# Patient Record
Sex: Female | Born: 1945 | Race: White | Hispanic: No | Marital: Married | Smoking: Never smoker
Health system: Southern US, Community
[De-identification: ages and names within clinical notes are randomized; demographics above are authoritative.]

## PROBLEM LIST (undated history)

## (undated) DIAGNOSIS — F32A Depression, unspecified: Secondary | ICD-10-CM

## (undated) DIAGNOSIS — F329 Major depressive disorder, single episode, unspecified: Secondary | ICD-10-CM

## (undated) HISTORY — PX: BREAST REDUCTION SURGERY: SHX8

---
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
## 2004-06-05 ENCOUNTER — Inpatient Hospital Stay: Payer: Self-pay | Admitting: Psychiatry

## 2006-08-02 ENCOUNTER — Emergency Department: Payer: Self-pay | Admitting: Emergency Medicine

## 2009-02-11 ENCOUNTER — Ambulatory Visit: Payer: Self-pay | Admitting: Unknown Physician Specialty

## 2009-02-21 ENCOUNTER — Ambulatory Visit: Payer: Self-pay | Admitting: Unknown Physician Specialty

## 2009-10-08 ENCOUNTER — Ambulatory Visit: Payer: Self-pay | Admitting: Unknown Physician Specialty

## 2009-10-09 ENCOUNTER — Ambulatory Visit: Payer: Self-pay | Admitting: Unknown Physician Specialty

## 2010-05-11 ENCOUNTER — Ambulatory Visit: Payer: Self-pay | Admitting: Internal Medicine

## 2011-04-17 ENCOUNTER — Emergency Department: Payer: Self-pay | Admitting: Emergency Medicine

## 2011-05-27 ENCOUNTER — Ambulatory Visit: Payer: Self-pay | Admitting: Internal Medicine

## 2012-05-31 ENCOUNTER — Ambulatory Visit: Payer: Self-pay | Admitting: Internal Medicine

## 2019-05-01 ENCOUNTER — Other Ambulatory Visit: Payer: Self-pay

## 2019-05-01 ENCOUNTER — Encounter: Payer: Self-pay | Admitting: Emergency Medicine

## 2019-05-01 ENCOUNTER — Emergency Department: Payer: Medicare Other

## 2019-05-01 ENCOUNTER — Emergency Department
Admission: EM | Admit: 2019-05-01 | Discharge: 2019-05-03 | Disposition: A | Discharge status: Nursing Home (Includes ICF) | Payer: Medicare Other | Attending: Emergency Medicine | Admitting: Emergency Medicine

## 2019-05-01 DIAGNOSIS — Z79899 Other long term (current) drug therapy: Secondary | ICD-10-CM | POA: Insufficient documentation

## 2019-05-01 DIAGNOSIS — R2681 Unsteadiness on feet: Secondary | ICD-10-CM | POA: Diagnosis not present

## 2019-05-01 DIAGNOSIS — R531 Weakness: Secondary | ICD-10-CM | POA: Insufficient documentation

## 2019-05-01 DIAGNOSIS — T426X2A Poisoning by other antiepileptic and sedative-hypnotic drugs, intentional self-harm, initial encounter: Secondary | ICD-10-CM | POA: Diagnosis not present

## 2019-05-01 DIAGNOSIS — Y939 Activity, unspecified: Secondary | ICD-10-CM | POA: Insufficient documentation

## 2019-05-01 DIAGNOSIS — S22080A Wedge compression fracture of T11-T12 vertebra, initial encounter for closed fracture: Secondary | ICD-10-CM | POA: Diagnosis not present

## 2019-05-01 DIAGNOSIS — F319 Bipolar disorder, unspecified: Secondary | ICD-10-CM | POA: Insufficient documentation

## 2019-05-01 DIAGNOSIS — Y999 Unspecified external cause status: Secondary | ICD-10-CM | POA: Diagnosis not present

## 2019-05-01 DIAGNOSIS — R5381 Other malaise: Secondary | ICD-10-CM

## 2019-05-01 DIAGNOSIS — W19XXXA Unspecified fall, initial encounter: Secondary | ICD-10-CM | POA: Diagnosis not present

## 2019-05-01 DIAGNOSIS — Y929 Unspecified place or not applicable: Secondary | ICD-10-CM | POA: Insufficient documentation

## 2019-05-01 DIAGNOSIS — F329 Major depressive disorder, single episode, unspecified: Secondary | ICD-10-CM | POA: Diagnosis present

## 2019-05-01 DIAGNOSIS — Z20822 Contact with and (suspected) exposure to covid-19: Secondary | ICD-10-CM | POA: Insufficient documentation

## 2019-05-01 DIAGNOSIS — T50902A Poisoning by unspecified drugs, medicaments and biological substances, intentional self-harm, initial encounter: Secondary | ICD-10-CM

## 2019-05-01 DIAGNOSIS — Z723 Lack of physical exercise: Secondary | ICD-10-CM | POA: Diagnosis not present

## 2019-05-01 HISTORY — DX: Depression, unspecified: F32.A

## 2019-05-01 LAB — CBC WITH DIFFERENTIAL/PLATELET
Abs Immature Granulocytes: 0.04 10*3/uL (ref 0.00–0.07)
Basophils Absolute: 0 10*3/uL (ref 0.0–0.1)
Basophils Relative: 0 %
Eosinophils Absolute: 0 10*3/uL (ref 0.0–0.5)
Eosinophils Relative: 0 %
HCT: 41.5 % (ref 36.0–46.0)
Hemoglobin: 13.1 g/dL (ref 12.0–15.0)
Immature Granulocytes: 1 %
Lymphocytes Relative: 4 %
Lymphs Abs: 0.3 10*3/uL — ABNORMAL LOW (ref 0.7–4.0)
MCH: 29.2 pg (ref 26.0–34.0)
MCHC: 31.6 g/dL (ref 30.0–36.0)
MCV: 92.4 fL (ref 80.0–100.0)
Monocytes Absolute: 0.1 10*3/uL (ref 0.1–1.0)
Monocytes Relative: 1 %
Neutro Abs: 8.1 10*3/uL — ABNORMAL HIGH (ref 1.7–7.7)
Neutrophils Relative %: 94 %
Platelets: 279 10*3/uL (ref 150–400)
RBC: 4.49 MIL/uL (ref 3.87–5.11)
RDW: 13.8 % (ref 11.5–15.5)
WBC: 8.6 10*3/uL (ref 4.0–10.5)
nRBC: 0 % (ref 0.0–0.2)

## 2019-05-01 LAB — ACETAMINOPHEN LEVEL: Acetaminophen (Tylenol), Serum: <10 ug/mL — ABNORMAL LOW (ref 10–30)

## 2019-05-01 LAB — COMPREHENSIVE METABOLIC PANEL
ALT: 15 U/L (ref 0–44)
AST: 28 U/L (ref 15–41)
Albumin: 4 g/dL (ref 3.5–5.0)
Alkaline Phosphatase: 102 U/L (ref 38–126)
Anion gap: 17 — ABNORMAL HIGH (ref 5–15)
BUN: 16 mg/dL (ref 8–23)
CO2: 20 mmol/L — ABNORMAL LOW (ref 22–32)
Calcium: 8.8 mg/dL — ABNORMAL LOW (ref 8.9–10.3)
Chloride: 102 mmol/L (ref 98–111)
Creatinine, Ser: 1 mg/dL (ref 0.44–1.00)
GFR calc Af Amer: >60 mL/min (ref >60)
GFR calc non Af Amer: 56 mL/min — ABNORMAL LOW (ref >60)
Glucose, Bld: 199 mg/dL — ABNORMAL HIGH (ref 70–99)
Potassium: 4.2 mmol/L (ref 3.5–5.1)
Sodium: 139 mmol/L (ref 135–145)
Total Bilirubin: 0.5 mg/dL (ref 0.3–1.2)
Total Protein: 7.8 g/dL (ref 6.5–8.1)

## 2019-05-01 LAB — URINE DRUG SCREEN, QUALITATIVE (ARMC ONLY)
Amphetamines, Ur Screen: NOT DETECTED
Barbiturates, Ur Screen: NOT DETECTED
Benzodiazepine, Ur Scrn: NOT DETECTED
Cannabinoid 50 Ng, Ur ~~LOC~~: NOT DETECTED
Cocaine Metabolite,Ur ~~LOC~~: NOT DETECTED
MDMA (Ecstasy)Ur Screen: NOT DETECTED
Methadone Scn, Ur: NOT DETECTED
Opiate, Ur Screen: NOT DETECTED
Phencyclidine (PCP) Ur S: NOT DETECTED
Tricyclic, Ur Screen: NOT DETECTED

## 2019-05-01 LAB — TROPONIN I (HIGH SENSITIVITY)
Troponin I (High Sensitivity): 7 ng/L (ref <18)
Troponin I (High Sensitivity): 6 ng/L (ref <18)

## 2019-05-01 LAB — URINALYSIS, COMPLETE (UACMP) WITH MICROSCOPIC
Bacteria, UA: NONE SEEN
Bilirubin Urine: NEGATIVE
Glucose, UA: NEGATIVE mg/dL
Hgb urine dipstick: NEGATIVE
Ketones, ur: 20 mg/dL — AB
Leukocytes,Ua: NEGATIVE
Nitrite: NEGATIVE
Protein, ur: 30 mg/dL — AB
Specific Gravity, Urine: 1.021 (ref 1.005–1.030)
Squamous Epithelial / HPF: NONE SEEN (ref 0–5)
pH: 5 (ref 5.0–8.0)

## 2019-05-01 LAB — TSH: TSH: 1.362 u[IU]/mL (ref 0.350–4.500)

## 2019-05-01 LAB — SALICYLATE LEVEL: Salicylate Lvl: <7 mg/dL — ABNORMAL LOW (ref 7.0–30.0)

## 2019-05-01 LAB — ETHANOL: Alcohol, Ethyl (B): <10 mg/dL (ref <10)

## 2019-05-01 LAB — T4, FREE: Free T4: 1.07 ng/dL (ref 0.61–1.12)

## 2019-05-01 MED ORDER — SODIUM CHLORIDE 0.9 % IV BOLUS
500.0000 mL | Freq: Once | INTRAVENOUS | Status: AC
  Administered 2019-05-01: 500 mL via INTRAVENOUS

## 2019-05-01 MED ORDER — LORAZEPAM 2 MG/ML IJ SOLN
1.0000 mg | Freq: Once | INTRAMUSCULAR | Status: AC
  Administered 2019-05-01: 1 mg via INTRAVENOUS
  Filled 2019-05-01: qty 1

## 2019-05-01 NOTE — ED Notes (Signed)
Spoken with Verlon Au from poison control, updated on pt status

## 2019-05-01 NOTE — ED Notes (Signed)
Pt anxious, given ativan 1mg  per md. Pt to xray at this time

## 2019-05-01 NOTE — ED Notes (Signed)
Attempted to cal family

## 2019-05-01 NOTE — ED Notes (Signed)
Hourly rounding reveals patient in room. No complaints, stable, in no acute distress.   

## 2019-05-01 NOTE — ED Notes (Signed)
Pt returned from xray. Hourly rounding reveals patient in room. No complaints, stable, in no acute distress.

## 2019-05-01 NOTE — ED Notes (Signed)
Pt's medications brought in by EMS counted by this RN, given to pharm tech Connellae to be delivered to pharmacy to store while patient in hospital. Form placed with patient's chart.

## 2019-05-01 NOTE — ED Notes (Signed)
Hourly rounding reveals patient in room. No complaints, stable, in no acute distress.

## 2019-05-01 NOTE — BH Assessment (Addendum)
Referral information for Psychiatric Hospitalization faxed to;   [facility] [phone]), Denied  . [facility] [phone] -or- [phone]) [phone] No beds available, currently on waitlist  . [facility] ([phone]---[phone]), No current beds available   . [facility] [phone]), Re-faxed manually  . [facility] [phone] or [phone]) Re-faxed manually   . [facility] [phone] or [phone]), Currently under review by Amy

## 2019-05-01 NOTE — Consult Note (Signed)
[facility] Face-to-Face Psychiatry Consult   Reason for Consult:  Suicide attempt  Referring Physician: Dr. Mayford Knife Patient Identification: Sharon Hogan MRN:  [id number] Principal Diagnosis: <principal problem not specified> Diagnosis:  Active Problems:   * No active hospital problems. *   Total Time spent with patient: 45 minutes  Subjective:   Sharon Hogan is a 74 y.o. female patient admitted with history of depression and anxiety worsening over the course of a few weeks culminating in a suicide attempt last night.    HPI:  Pt is a 74 y.o. female patient admitted with history of depression worsening over the course of a few weeks culminating in a suicide attempt last night.  Patient reportedly took approximately 12 pills each of Klonopin and Lamictal in an attempt to commit suicide.  Patient reports that she has never tried to commit suicide previously and that she had been thinking about taking her pills as a form of suicide for the past couple of weeks.  Patient denies being acutely suicidal at this moment.  When asked about the reason for her suicide attempt patient reported severe anxiety around showering and that she has not showered since last July due to severe depression, agoraphobia and claustrophobia patient also reported that she is unable to get into the tub due to weak legs and pain in her back.  Patient has had multiple inpatient psychiatric stays in the past and she has been treated as an outpatient by Dr. Omelia Blackwater for the last couple of years however her last outpatient visit was around last December.  Patient reported having 1 biological child and 2 stepchildren who live in another part of the state.  She reports having a good relationship with them by phone but that she does not want them to see her in person because she is embarrassed about her state of self-care at the moment.  She reported that her husband use to help her with her hygiene however his back has been bad for a little  while and he has been unable.  She reports a recent history of panic attacks that began around last December and have contributed to her overall condition at this time.  Patient was visibly anxious and tearful throughout the interview seem to be in distress and was given Ativan to help calm down.   Past Psychiatric History: Depression treated with Lamictal and Klonopin.  2 prior psychiatric admissions for similar circumstances last one was approximately "a couple years ago".  Patient denies prior suicide attempts but does endorse prior suicidal ideation.  Currently receiving treatment from outpatient Dr. Mitzi Hansen.  Risk to Self: Suicidal Ideation: Yes-Currently Present Suicidal Intent: Yes-Currently Present Is patient at risk for suicide?: Yes Suicidal Plan?: Yes-Currently Present Specify Current Suicidal Plan: (overdose on Lamictal and Trazodone) Access to Means: Yes Specify Access to Suicidal Means: took Rx pills What has been your use of drugs/alcohol within the last 12 months?: none How many times?: 0 Other Self Harm Risks: conflict with husband Triggers for Past Attempts: None known Intentional Self Injurious Behavior: None Risk to Others: Homicidal Ideation: No Thoughts of Harm to Others: No Current Homicidal Intent: No Current Homicidal Plan: No Access to Homicidal Means: No Identified Victim: none History of harm to others?: No Assessment of Violence: None Noted Violent Behavior Description: none Does patient have access to weapons?: No Criminal Charges Pending?: No Does patient have a court date: No Prior Inpatient Therapy: Prior Inpatient Therapy: Yes Prior Therapy Dates: "a few year back" Prior  Therapy Facilty/Provider(s): [facility] Reason for Treatment: depression Prior Outpatient Therapy: Prior Outpatient Therapy: Yes Prior Therapy Dates: active Prior Therapy Facilty/Provider(s): Dr. Omelia Blackwater Reason for Treatment: depression Does patient have an ACCT team?:  No Does patient have Intensive In-House Services?  : No Does patient have [facility] services? : No Does patient have P4CC services?: No  Past Medical History:  Past Medical History:  Diagnosis Date  . Depression     Past Surgical History:  Procedure Laterality Date  . BREAST REDUCTION SURGERY     Family History: No family history on file. Family Psychiatric  History: Unknown Social History:  Social History   Substance and Sexual Activity  Alcohol Use Not Currently     Social History   Substance and Sexual Activity  Drug Use Not Currently    Social History   Socioeconomic History  . Marital status: Married    Spouse name: Not on file  . Number of children: Not on file  . Years of education: Not on file  . Highest education level: Not on file  Occupational History  . Not on file  Tobacco Use  . Smoking status: Never Smoker  . Smokeless tobacco: Never Used  Substance and Sexual Activity  . Alcohol use: Not Currently  . Drug use: Not Currently  . Sexual activity: Not on file  Other Topics Concern  . Not on file  Social History Narrative  . Not on file   Social Determinants of Health   Financial Resource Strain:   . Difficulty of Paying Living Expenses: Not on file  Food Insecurity:   . Worried About Programme researcher, broadcasting/film/video in the Last Year: Not on file  . Ran Out of Food in the Last Year: Not on file  Transportation Needs:   . Lack of Transportation (Medical): Not on file  . Lack of Transportation (Non-Medical): Not on file  Physical Activity:   . Days of Exercise per Week: Not on file  . Minutes of Exercise per Session: Not on file  Stress:   . Feeling of Stress : Not on file  Social Connections:   . Frequency of Communication with Friends and Family: Not on file  . Frequency of Social Gatherings with Friends and Family: Not on file  . Attends Religious Services: Not on file  . Active Member of Clubs or Organizations: Not on file  . Attends Tax inspector Meetings: Not on file  . Marital Status: Not on file   Additional Social History: Patient has 1 biological daughter and 2 stepchildren who live in another part of the state that she has a good relationship with by phone.  Patient's husband is reported to be very supportive.    Allergies:   Allergies  Allergen Reactions  . Penicillin G Rash    Only in the injection form    Labs:  Results for orders placed or performed during the hospital encounter of 05/01/19 (from the past 48 hour(s))  CBC with Differential     Status: Abnormal   Collection Time: 05/01/19  8:45 AM  Result Value Ref Range   WBC 8.6 4.0 - 10.5 K/uL   RBC 4.49 3.87 - 5.11 MIL/uL   Hemoglobin 13.1 12.0 - 15.0 g/dL   HCT 88.5 02.7 - 74.1 %   MCV 92.4 80.0 - 100.0 fL   MCH 29.2 26.0 - 34.0 pg   MCHC 31.6 30.0 - 36.0 g/dL   RDW 28.7 86.7 - 67.2 %  Platelets 279 150 - 400 K/uL   nRBC 0.0 0.0 - 0.2 %   Neutrophils Relative % 94 %   Neutro Abs 8.1 (H) 1.7 - 7.7 K/uL   Lymphocytes Relative 4 %   Lymphs Abs 0.3 (L) 0.7 - 4.0 K/uL   Monocytes Relative 1 %   Monocytes Absolute 0.1 0.1 - 1.0 K/uL   Eosinophils Relative 0 %   Eosinophils Absolute 0.0 0.0 - 0.5 K/uL   Basophils Relative 0 %   Basophils Absolute 0.0 0.0 - 0.1 K/uL   Immature Granulocytes 1 %   Abs Immature Granulocytes 0.04 0.00 - 0.07 K/uL    Comment: Performed at [facility], [address] [place], [place] [address]  Comprehensive metabolic panel     Status: Abnormal   Collection Time: 05/01/19  8:45 AM  Result Value Ref Range   Sodium 139 135 - 145 mmol/L   Potassium 4.2 3.5 - 5.1 mmol/L   Chloride 102 98 - 111 mmol/L   CO2 20 (L) 22 - 32 mmol/L   Glucose, Bld 199 (H) 70 - 99 mg/dL    Comment: Glucose reference range applies only to samples taken after fasting for at least 8 hours.   BUN 16 8 - 23 mg/dL   Creatinine, Ser 2.12 0.44 - 1.00 mg/dL   Calcium 8.8 (L) 8.9 - 10.3 mg/dL   Total Protein 7.8 6.5 - 8.1 g/dL    Albumin 4.0 3.5 - 5.0 g/dL   AST 28 15 - 41 U/L   ALT 15 0 - 44 U/L    Comment: RESULT CONFIRMED BY MANUAL DILUTION SDR   Alkaline Phosphatase 102 38 - 126 U/L   Total Bilirubin 0.5 0.3 - 1.2 mg/dL   GFR calc non Af Amer 56 (L) >60 mL/min   GFR calc Af Amer >60 >60 mL/min   Anion gap 17 (H) 5 - 15    Comment: Performed at [facility], [address] [place], [place] [address]  Urinalysis, Complete w Microscopic     Status: Abnormal   Collection Time: 05/01/19  8:45 AM  Result Value Ref Range   Color, Urine YELLOW (A) YELLOW   APPearance HAZY (A) CLEAR   Specific Gravity, Urine 1.021 1.005 - 1.030   pH 5.0 5.0 - 8.0   Glucose, UA NEGATIVE NEGATIVE mg/dL   Hgb urine dipstick NEGATIVE NEGATIVE   Bilirubin Urine NEGATIVE NEGATIVE   Ketones, ur 20 (A) NEGATIVE mg/dL   Protein, ur 30 (A) NEGATIVE mg/dL   Nitrite NEGATIVE NEGATIVE   Leukocytes,Ua NEGATIVE NEGATIVE   WBC, UA 0-5 0 - 5 WBC/hpf   Bacteria, UA NONE SEEN NONE SEEN   Squamous Epithelial / LPF NONE SEEN 0 - 5   Mucus PRESENT     Comment: Performed at [facility], [address] [place], [place] [address]  TSH     Status: None   Collection Time: 05/01/19  8:45 AM  Result Value Ref Range   TSH 1.362 0.350 - 4.500 uIU/mL    Comment: Performed by a 3rd Generation assay with a functional sensitivity of <=0.01 uIU/mL. Performed at [facility], [address] [place], [place] [address]   T4, free     Status: None   Collection Time: 05/01/19  8:45 AM  Result Value Ref Range   Free T4 1.07 0.61 - 1.12 ng/dL    Comment: (NOTE) Biotin ingestion may interfere with free T4 tests. If the results are inconsistent with the  TSH level, previous test results, or the clinical presentation, then consider biotin interference. If needed, order repeat testing after stopping biotin. Performed at [facility], [address], [place], [place] [address]   Urine Drug Screen, [facility] only)      Status: None   Collection Time: 05/01/19  8:45 AM  Result Value Ref Range   Tricyclic, Ur Screen NONE DETECTED NONE DETECTED   Amphetamines, Ur Screen NONE DETECTED NONE DETECTED   MDMA (Ecstasy)Ur Screen NONE DETECTED NONE DETECTED   Cocaine Metabolite,Ur [place] NONE DETECTED NONE DETECTED   Opiate, Ur Screen NONE DETECTED NONE DETECTED   Phencyclidine (PCP) Ur S NONE DETECTED NONE DETECTED   Cannabinoid 50 Ng, Ur [place] NONE DETECTED NONE DETECTED   Barbiturates, Ur Screen NONE DETECTED NONE DETECTED   Benzodiazepine, Ur Scrn NONE DETECTED NONE DETECTED   Methadone Scn, Ur NONE DETECTED NONE DETECTED    Comment: (NOTE) Tricyclics + metabolites, urine    Cutoff 1000 ng/mL Amphetamines + metabolites, urine  Cutoff 1000 ng/mL MDMA (Ecstasy), urine              Cutoff 500 ng/mL Cocaine Metabolite, urine          Cutoff 300 ng/mL Opiate + metabolites, urine        Cutoff 300 ng/mL Phencyclidine (PCP), urine         Cutoff 25 ng/mL Cannabinoid, urine                 Cutoff 50 ng/mL Barbiturates + metabolites, urine  Cutoff 200 ng/mL Benzodiazepine, urine              Cutoff 200 ng/mL Methadone, urine                   Cutoff 300 ng/mL The urine drug screen provides only a preliminary, unconfirmed analytical test result and should not be used for non-medical purposes. Clinical consideration and professional judgment should be applied to any positive drug screen result due to possible interfering substances. A more specific alternate chemical method must be used in order to obtain a confirmed analytical result. Gas chromatography / mass spectrometry (GC/MS) is the preferred confirmat ory method. Performed at [facility], [address] [place], [place] [address]   Ethanol     Status: None   Collection Time: 05/01/19  8:45 AM  Result Value Ref Range   Alcohol, Ethyl (B) <10 <10 mg/dL    Comment: (NOTE) Lowest detectable limit for serum alcohol is 10 mg/dL. For medical  purposes only. Performed at [facility], [address] [place], [place] [address]   Troponin I (High Sensitivity)     Status: None   Collection Time: 05/01/19  8:45 AM  Result Value Ref Range   Troponin I (High Sensitivity) 7 <18 ng/L    Comment: (NOTE) Elevated high sensitivity troponin I (hsTnI) values and significant  changes across serial measurements may suggest ACS but many other  chronic and acute conditions are known to elevate hsTnI results.  Refer to the "Links" section for chest pain algorithms and additional  guidance. Performed at [facility], [address] [place], [place] [address]   Acetaminophen level     Status: Abnormal   Collection Time: 05/01/19  8:45 AM  Result Value Ref Range   Acetaminophen (Tylenol), Serum <10 (L) 10 - 30 ug/mL    Comment: (NOTE) Therapeutic concentrations vary significantly. A range of 10-30 ug/mL  may be an effective concentration  for many patients. However, some  are best treated at concentrations outside of this range. Acetaminophen concentrations >150 ug/mL at 4 hours after ingestion  and >50 ug/mL at 12 hours after ingestion are often associated with  toxic reactions. Performed at [facility], [address] [place], [place] [address]   Salicylate level     Status: Abnormal   Collection Time: 05/01/19  8:45 AM  Result Value Ref Range   Salicylate Lvl <4.0 (L) 7.0 - 30.0 mg/dL    Comment: Performed at [facility], [place], [place] [address]  Troponin I (High Sensitivity)     Status: None   Collection Time: 05/01/19 10:13 AM  Result Value Ref Range   Troponin I (High Sensitivity) 6 <18 ng/L    Comment: (NOTE) Elevated high sensitivity troponin I (hsTnI) values and significant  changes across serial measurements may suggest ACS but many other  chronic and acute conditions are known to elevate hsTnI results.  Refer to the "Links" section for chest pain algorithms  and additional  guidance. Performed at [facility], [address] [place],  [address]     No current facility-administered medications for this encounter.   No current outpatient medications on file.    Musculoskeletal: Strength & Muscle Tone: within normal limits Gait & Station: Patient was seated for the duration of interview but reported that she has pain in her legs and back and she is not sure that she can walk at the moment Patient leans: N/A  Psychiatric Specialty Exam: Physical Exam  Review of Systems  Psychiatric/Behavioral: Positive for suicidal ideas. Negative for agitation, confusion and hallucinations. The patient is nervous/anxious.     Blood pressure (!) 142/85, pulse 89, temperature 97.6 F (36.4 C), temperature source Oral, resp. rate 14, height 5\' 4"  (1.626 m), weight 68 kg, SpO2 97 %.Body mass index is 25.75 kg/m.  General Appearance: Disheveled  Eye Contact:  Fair  Speech:  Clear and Coherent  Volume:  Normal  Mood:  Anxious and Depressed  Affect:  Congruent and Depressed  Thought Process:  Coherent  Orientation:  Full (Time, Place, and Person)  Thought Content:  Rumination  Suicidal Thoughts:  Yes.  with intent/plan  Homicidal Thoughts:  No  Memory:  Recent;   Fair  Judgement:  Poor  Insight:  Lacking  Psychomotor Activity:  Normal  Concentration:  Concentration: Fair  Recall:  [employer] of Knowledge:  Fair  Language:  Good  Akathisia:  No  Handed:  Right  AIMS (if indicated):     Assets:  Communication Skills Desire for Improvement Housing Social Support  ADL's:  Impaired  Cognition:  WNL  Sleep:        Treatment Plan Summary:  74 year old female acutely depressed and anxious on interview attempted suicide last night.  Patient is a risk to self and unable to care for herself at this time.  Recommend inpatient geriatric psych stay.  Diagnosis: MDD severe  Disposition: Recommend psychiatric Inpatient admission when  medically cleared.  Dixie Dials, MD 05/01/2019 1:59 PM

## 2019-05-01 NOTE — ED Triage Notes (Signed)
Pt presents to ED via [facility]. Per EMS pt initially called out for a fall, upon arrival pt endorses intentionally taking too much Lamictal. Upon arrival to ED pt endorses taking 12 200mg  Lamictal last night at approx 2130, then states she vomited after taking Lamictal. Pt denies intentionally ingesting any other prescription medication. Pt is alert upon arrival to ED, noted to be anxious.

## 2019-05-01 NOTE — ED Notes (Addendum)
This RN called and updated patient's husband Fleet [phone] with patient's permission. Pt's husband reports that patient has not had a shower since last July. Reports initially pt would wait and shower just before going to see her PCP, however with Covid and tele-health visits patient stopped showering altogether. Pt's husband reports that patient will not allow him to assist her with cleaning.   This RN explained IVC to patient's husband and that psychiatrist would be evaluating patient some time today. Pt's husband states understanding at this time.

## 2019-05-01 NOTE — Care Management (Signed)
TOC aware of consult and potential SNF placement. Will wait for psych consult and proceed as advised.

## 2019-05-01 NOTE — ED Provider Notes (Signed)
Plan per psychiatry as [facility] psych placement.   Emily Filbert, MD 05/01/19 [phone]

## 2019-05-01 NOTE — ED Notes (Signed)
Hourly rounding reveals patient in room. No complaints, stable, in no acute distress. Pt provided with bedpan per request. Taken off bedpan approx 15 minutes later

## 2019-05-01 NOTE — ED Notes (Signed)
Immediately upon arrival pt bathed by this RN, Shawna Orleans, EDT and Lenord Fellers, RN. Pt with noted severe self neglect. Pt with severe MASD noted under R breast and to vaginal area. Barrier cream applied by this RN after cleansing site. Wound noted to R outer labia at this time. Pt with large dry flakes of skin in her hair and to entire body. Pt states severe claustrophobia and has been unable to take a bath for "a while".

## 2019-05-01 NOTE — ED Notes (Addendum)
Hourly rounding reveals patient in room. No complaints, stable, in no acute distress.   

## 2019-05-01 NOTE — ED Notes (Signed)
Repeat trop collected by this RN, pt repositioned in bed, pt currently doing tele-visit with TTS at this time.

## 2019-05-01 NOTE — ED Provider Notes (Signed)
[facility] Emergency Department Provider Note       Time seen: ----------------------------------------- 8:17 AM on 05/01/2019 -----------------------------------------   I have reviewed the triage vital signs and the nursing notes.  HISTORY Chief Complaint No chief complaint on file.    HPI Sharon Hogan is a 74 y.o. female with a history of GERD, anxiety and depression who presents to the ED for intentional drug overdose.  Patient states that around 9 PM last night she took 12 Lamictal that were 200 mg.  Patient states she was trying to harm herself.  She did fall.  She denies any complaints at this time.  Reportedly she is not caring for herself and the husband cannot care for her either  No past medical history on file.  There are no problems to display for this patient.  Allergies Patient has no allergy information on record.  Social History Social History   Tobacco Use  . Smoking status: Not on file  Substance Use Topics  . Alcohol use: Not on file  . Drug use: Not on file    Review of Systems Constitutional: Negative for fever. Cardiovascular: Negative for chest pain. Respiratory: Negative for shortness of breath. Gastrointestinal: Negative for abdominal pain, vomiting and diarrhea. Musculoskeletal: Negative for back pain. Skin: Positive for skin disorder Neurological: Negative for headaches, focal weakness or numbness.  All systems negative/normal/unremarkable except as stated in the HPI  ____________________________________________   PHYSICAL EXAM:  VITAL SIGNS: ED Triage Vitals  Enc Vitals Group     BP      Pulse      Resp      Temp      Temp src      SpO2      Weight      Height      Head Circumference      Peak Flow      Pain Score      Pain Loc      Pain Edu?      Excl. in [place]?     Constitutional: Alert and oriented.  No acute distress, disheveled appearance Eyes: Conjunctivae are normal. Normal extraocular  movements. ENT      Head: Normocephalic and atraumatic.      Nose: No congestion/rhinnorhea.      Mouth/Throat: Mucous membranes are moist.      Neck: No stridor. Cardiovascular: Normal rate, regular rhythm. No murmurs, rubs, or gallops. Respiratory: Normal respiratory effort without tachypnea nor retractions. Breath sounds are clear and equal bilaterally. No wheezes/rales/rhonchi. Gastrointestinal: Soft and nontender. Normal bowel sounds Musculoskeletal: Nontender with normal range of motion in extremities. No lower extremity tenderness nor edema. Neurologic:  Normal speech and language. No gross focal neurologic deficits are appreciated.  Skin: Very dry skin with extensive seborrhea Psychiatric: Flat affect ____________________________________________  EKG: Interpreted by me.  Sinus rhythm the rate of 99 bpm, borderline long QT, normal axis  ____________________________________________  ED COURSE:  As part of my medical decision making, I reviewed the following data within the [employer] History obtained from family if available, nursing notes, old chart and ekg, as well as notes from prior ED visits. Patient presented for a drug overdose, we will assess with labs and imaging as indicated at this time.   Procedures  BRIGHTON DELIO was evaluated in Emergency Department on 05/01/2019 for the symptoms described in the history of present illness. She was evaluated in the context of the global COVID-19 pandemic, which necessitated  consideration that the patient might be at risk for infection with the SARS-CoV-2 virus that causes COVID-19. Institutional protocols and algorithms that pertain to the evaluation of patients at risk for COVID-19 are in a state of rapid change based on information released by regulatory bodies including the CDC and federal and state organizations. These policies and algorithms were followed during the patient's care in the ED.   ____________________________________________   LABS (pertinent positives/negatives)  Labs Reviewed  CBC WITH DIFFERENTIAL/PLATELET - Abnormal; Notable for the following components:      Result Value   Neutro Abs 8.1 (*)    Lymphs Abs 0.3 (*)    All other components within normal limits  COMPREHENSIVE METABOLIC PANEL - Abnormal; Notable for the following components:   CO2 20 (*)    Glucose, Bld 199 (*)    Calcium 8.8 (*)    GFR calc non Af Amer 56 (*)    Anion gap 17 (*)    All other components within normal limits  URINALYSIS, COMPLETE (UACMP) WITH MICROSCOPIC - Abnormal; Notable for the following components:   Color, Urine YELLOW (*)    APPearance HAZY (*)    Ketones, ur 20 (*)    Protein, ur 30 (*)    All other components within normal limits  ACETAMINOPHEN LEVEL - Abnormal; Notable for the following components:   Acetaminophen (Tylenol), Serum <10 (*)    All other components within normal limits  SALICYLATE LEVEL - Abnormal; Notable for the following components:   Salicylate Lvl <7.0 (*)    All other components within normal limits  TSH  T4, FREE  URINE DRUG SCREEN, QUALITATIVE ([facility] ONLY)  ETHANOL  TROPONIN I (HIGH SENSITIVITY)  TROPONIN I (HIGH SENSITIVITY)    ____________________________________________   DIFFERENTIAL DIAGNOSIS   Intentional drug overdose, depression, dehydration, electrolyte abnormality, occult infection, hypothyroidism  FINAL ASSESSMENT AND PLAN  Overdose, deconditioning, depression   Plan: The patient had presented for intentional drug overdose. Patient's labs were grossly unremarkable, anion gap was slightly elevated which will improve with fluids.  She has been placed under involuntary commitment.  I discussed with poison control who states she is medically cleared from Lamictal overdose.  I have placed psychiatry as well as social work consults for her.   Ulice Dash, MD    Note: This note was generated in part or whole  with voice recognition software. Voice recognition is usually quite accurate but there are transcription errors that can and very often do occur. I apologize for any typographical errors that were not detected and corrected.     Emily Filbert, MD 05/01/19 1010

## 2019-05-01 NOTE — BH Assessment (Addendum)
Tele Assessment Note   Patient Name: Sharon Hogan MRN: [id number] Referring Physician: Lenise Arena Location of Patient: [facility]ED Location of Provider: [place] Department  Per EDP Report: Sharon Hogan is a 74 y.o. female with a history of GERD, anxiety and depression who presents to the ED for intentional drug overdose.  Patient states that around 9 PM last night she took 12 Lamictal that were 200 mg.  Patient states she was trying to harm herself.  She did fall.  She denies any complaints at this time.  Reportedly she is not caring for herself and the husband cannot care for her either.  Per RN and Collateral Report: Pt's husband reports that patient has not had a shower since last July. Reports initially pt would wait and shower just before going to see her PCP, however with Covid and tele-health visits patient stopped showering altogether. Pt's husband reports that patient will not allow him to assist her with cleaning.   TTS: Patient states that she has a long history of depression, agorophobia and claustrophobia.  Patient states that she has been treated by Dr. Rosine Door on an outpatient basis and states that she has been hospitalized on two occasions in the past at [facility] and [facility].    Patient states that she ingested an overdose of Lamictal and Klonopin in a suicide attempt because she does not want to live anymore.  When asked why, she states that her husband is always fussing at her for not bathing.  Patient states that taking a shower is very anxiety provoking for her.  She states that her husband is generally supportive, but struggles with her not showering. Patient states that her depression has recently worsened..  Patient states that she is not homicidal and denies any history of psychosis or substance use issues.  Patient states that she has one daughter and two step-children, but they are not local and her husband is her main source of support.  She states  that her sleep is okay as long as she takes her Trazodone.  Patient states that her appetite has been good as well.  Patient presented as oriented and alert, her mood depressed and her affect flat.  Her anxiety was severe.  Patient's judgment, insight and impulse control are impaired.  Her thoughts are organized and her memory intact.  Her eye contact is good, but her speech  Diagnosis: F31.9 Bipolar Disorder Depressed  Past Medical History:  Past Medical History:  Diagnosis Date  . Depression     Past Surgical History:  Procedure Laterality Date  . BREAST REDUCTION SURGERY      Family History: No family history on file.  Social History:  reports that she has never smoked. She has never used smokeless tobacco. She reports previous alcohol use. She reports previous drug use.  Additional Social History:  Alcohol / Drug Use Pain Medications: see MAR Prescriptions: see MAR Over the Counter: see MAR History of alcohol / drug use?: No history of alcohol / drug abuse Longest period of sobriety (when/how long): NA  CIWA: CIWA-Ar BP: (!) 142/85 Pulse Rate: 89 COWS:    Allergies:  Allergies  Allergen Reactions  . Penicillin G Rash    Only in the injection form    Home Medications: (Not in a hospital admission)   OB/GYN Status:  No LMP recorded.  General Assessment Data Location of Assessment: [facility] ED TTS Assessment: In system Is this a Tele or Face-to-Face Assessment?: Tele Assessment Is this an  Initial Assessment or a Re-assessment for this encounter?: Initial Assessment Patient Accompanied by:: N/A Language Other than English: No Living Arrangements: Other (Comment)(lives with husband) What gender do you identify as?: Female Marital status: Married Pregnancy Status: No Living Arrangements: Spouse/significant other Can pt return to current living arrangement?: Yes Admission Status: Involuntary Petitioner: ED Attending Is patient capable of signing voluntary  admission?: Yes Referral Source: Self/Family/Friend Insurance type: (Medicare/[employer])     Crisis Care Plan Living Arrangements: Spouse/significant other Legal Guardian: Other:(self) Name of Psychiatrist: Dr Omelia Blackwater Name of Therapist: none  Education Status Is patient currently in school?: No  Risk to self with the past 6 months Suicidal Ideation: Yes-Currently Present Has patient been a risk to self within the past 6 months prior to admission? : No Suicidal Intent: Yes-Currently Present Has patient had any suicidal intent within the past 6 months prior to admission? : No Is patient at risk for suicide?: Yes Suicidal Plan?: Yes-Currently Present Has patient had any suicidal plan within the past 6 months prior to admission? : No Specify Current Suicidal Plan: (overdose on Lamictal and Trazodone) Access to Means: Yes Specify Access to Suicidal Means: took Rx pills What has been your use of drugs/alcohol within the last 12 months?: none Previous Attempts/Gestures: No How many times?: 0 Other Self Harm Risks: conflict with husband Triggers for Past Attempts: None known Intentional Self Injurious Behavior: None Family Suicide History: No Recent stressful life event(s): Conflict (Comment)(conflict with husband) Persecutory voices/beliefs?: No Depression: Yes Depression Symptoms: Despondent, Isolating, Loss of interest in usual pleasures, Feeling worthless/self pity Substance abuse history and/or treatment for substance abuse?: No Suicide prevention information given to non-admitted patients: Not applicable  Risk to Others within the past 6 months Homicidal Ideation: No Does patient have any lifetime risk of violence toward others beyond the six months prior to admission? : No Thoughts of Harm to Others: No Current Homicidal Intent: No Current Homicidal Plan: No Access to Homicidal Means: No Identified Victim: none History of harm to others?: No Assessment of  Violence: None Noted Violent Behavior Description: none Does patient have access to weapons?: No Criminal Charges Pending?: No Does patient have a court date: No Is patient on probation?: No  Psychosis Hallucinations: None noted Delusions: None noted  Mental Status Report Appearance/Hygiene: Disheveled, Poor hygiene Eye Contact: Good Motor Activity: Freedom of movement Speech: Logical/coherent Level of Consciousness: Alert Mood: Depressed, Anxious Affect: Flat Anxiety Level: Severe Thought Processes: Coherent, Relevant Judgement: Impaired Orientation: Person, Place, Time, Situation Obsessive Compulsive Thoughts/Behaviors: None  Cognitive Functioning Concentration: Normal Memory: Recent Intact, Remote Intact Is patient IDD: No Insight: Poor Impulse Control: Poor Appetite: Good Have you had any weight changes? : No Change Sleep: No Change Total Hours of Sleep: 8 Vegetative Symptoms: None  ADLScreening [facility] Assessment Services) Patient's cognitive ability adequate to safely complete daily activities?: Yes Patient able to express need for assistance with ADLs?: Yes Independently performs ADLs?: Yes (appropriate for developmental age)  Prior Inpatient Therapy Prior Inpatient Therapy: Yes Prior Therapy Dates: "a few year back" Prior Therapy Facilty/Provider(s): [facility] and [facility] Reason for Treatment: depression  Prior Outpatient Therapy Prior Outpatient Therapy: Yes Prior Therapy Dates: active Prior Therapy Facilty/Provider(s): Dr. Omelia Blackwater Reason for Treatment: depression Does patient have an ACCT team?: No Does patient have Intensive In-House Services?  : No Does patient have [facility] services? : No Does patient have P4CC services?: No  ADL Screening (condition at time of admission) Patient's cognitive ability adequate to safely complete daily activities?:  Yes Is the patient deaf or have difficulty hearing?: No Does the patient have difficulty seeing, even  when wearing glasses/contacts?: No Does the patient have difficulty concentrating, remembering, or making decisions?: No Patient able to express need for assistance with ADLs?: Yes Does the patient have difficulty dressing or bathing?: No Independently performs ADLs?: Yes (appropriate for developmental age) Does the patient have difficulty walking or climbing stairs?: No Weakness of Legs: None Weakness of Arms/Hands: None  Home Assistive Devices/Equipment Home Assistive Devices/Equipment: None  Therapy Consults (therapy consults require a physician order) PT Evaluation Needed: No OT Evalulation Needed: No SLP Evaluation Needed: No Abuse/Neglect Assessment (Assessment to be complete while patient is alone) Abuse/Neglect Assessment Can Be Completed: Yes Physical Abuse: Denies Verbal Abuse: Denies Sexual Abuse: Denies Exploitation of patient/patient's resources: Denies Values / Beliefs Cultural Requests During Hospitalization: None Spiritual Requests During Hospitalization: None Consults Spiritual Care Consult Needed: No Transition of Care Team Consult Needed: No Advance Directives (For Healthcare) Does Patient Have a Medical Advance Directive?: No Would patient like information on creating a medical advance directive?: No - Patient declined Nutrition Screen- [facility] Adult/[facility]/AP Has the patient recently lost weight without trying?: No Has the patient been eating poorly because of a decreased appetite?: No Malnutrition Screening Tool Score: 0        Disposition: Per Malachy Chamber, DNP, Geriatric Psych Inpatient is recommended Disposition Initial Assessment Completed for this Encounter: Yes  This service was provided via telemedicine using a 2-way, interactive audio and video technology.  Names of all persons participating in this telemedicine service and their role in this encounter. Name: Sharon Hogan Role: patient  Name: Dannielle Huh Arvel Oquinn Role: TTS  Name:  Role:   Name:  Role:      Daphene Calamity 05/01/2019 11:06 AM

## 2019-05-01 NOTE — ED Notes (Signed)
Pt provided with water by this RN.  

## 2019-05-02 ENCOUNTER — Emergency Department: Payer: Medicare Other

## 2019-05-02 DIAGNOSIS — F319 Bipolar disorder, unspecified: Secondary | ICD-10-CM | POA: Diagnosis not present

## 2019-05-02 LAB — RESPIRATORY PANEL BY RT PCR (FLU A&B, COVID)
Influenza A by PCR: NEGATIVE
Influenza B by PCR: NEGATIVE
SARS Coronavirus 2 by RT PCR: NEGATIVE

## 2019-05-02 MED ORDER — CARIPRAZINE HCL 3 MG PO CAPS
3.0000 mg | ORAL_CAPSULE | Freq: Every day | ORAL | Status: DC
  Administered 2019-05-02: 3 mg via ORAL
  Filled 2019-05-02 (×2): qty 1

## 2019-05-02 MED ORDER — PANTOPRAZOLE SODIUM 40 MG PO TBEC: 40.0000 mg | DELAYED_RELEASE_TABLET | Freq: Every day | ORAL | Status: DC

## 2019-05-02 MED ORDER — LAMOTRIGINE 100 MG PO TABS: 200.0000 mg | ORAL_TABLET | Freq: Every day | ORAL | Status: DC

## 2019-05-02 MED ORDER — PANTOPRAZOLE SODIUM 40 MG PO TBEC
40.0000 mg | DELAYED_RELEASE_TABLET | Freq: Every day | ORAL | Status: DC
  Administered 2019-05-03: 40 mg via ORAL
  Filled 2019-05-02: qty 1

## 2019-05-02 MED ORDER — TRAZODONE HCL 50 MG PO TABS
150.0000 mg | ORAL_TABLET | Freq: Every evening | ORAL | Status: DC | PRN
  Administered 2019-05-02: 150 mg via ORAL
  Filled 2019-05-02: qty 1

## 2019-05-02 MED ORDER — NYSTATIN 100000 UNIT/GM EX POWD
Freq: Once | CUTANEOUS | Status: AC
  Filled 2019-05-02: qty 15

## 2019-05-02 MED ORDER — LAMOTRIGINE 100 MG PO TABS
200.0000 mg | ORAL_TABLET | Freq: Every day | ORAL | Status: DC
  Administered 2019-05-03: 200 mg via ORAL
  Filled 2019-05-02: qty 2

## 2019-05-02 NOTE — ED Notes (Signed)
PT repeatedly stating she does not want a catheter, informed patient she does not have a catheter, but a device meant to catch her urine. Pt states "I don't want a wick" Asked patient how she would go to the bathroom, pt did not give an answer, but stated "I don't want a wick"   Asked patient how she gets up to the bathroom at home, she stated she holds on to things, but states right now she is dizzy.  Pt stated she might be able to get up with help, but is too dizzy right now.  Informed patient that if she did not regularly get up to go to the bathroom then she would need the purewick to stay in place otherwise she would be saturated in urine.  Informed her the device is not inside her, rather that it is there to catch urine as she goes to the bathroom.  Pt admits to being nervous. Pt stated she took 12 Trazodone and 12 Lamictal in an attempt to kill herself. Pt admits to being depressed, pt has poor hygiene and disheveled.  Pt taken off monitoring equipment at this time as she is medically cleared.  Pt currently in hospital gown.  Pt currently denies any SI/HI. Will continue to monitor.

## 2019-05-02 NOTE — BH Assessment (Signed)
Late Entry- Per the request of [facility] ([phone]), writer sent them; IVC, COVID, Chest, Thoracic Spine, Lumber Spine X-ray's and EKG.  [facility] also requested a Ortho Consult about whether or not the patient will need ongoing care from the fall (bone fracture) she had prior to coming to the ER. Writer spoke with ER MD (Dr. Isaacs) about it. He will put a note for the request. Per the request of [facility] ([phone]), writer sent them; IVC, COVID, Chest, Thoracic Spine, Lumber Spine X-ray's and EKG.  [facility] also requested a Ortho Consult about whether or not the patient will need ongoing care from the fall (bone fracture) she had prior to coming to the ER. Writer spoke with ER MD (Dr. Isaacs) about it. He will put a note for the request. 

## 2019-05-02 NOTE — ED Notes (Signed)
Pt resting quietly in room with eyes closed, respirations equal and unlabored. Continue to monitor.

## 2019-05-02 NOTE — Evaluation (Signed)
Physical Therapy Evaluation Patient Details Name: Sharon Hogan MRN: [id number] DOB: 11/07/1945 Today's Date: 05/02/2019   History of Present Illness  Pt admitted for suicidal attempt by intentional drug overdose. Pt will fall and transported to hospital. HIstory includes depression, anxiety, agorophobia, and claustrophobia. Pt is generally dischevled and unkempt. Per notes, hasn't showered since last July and is unable to care for herself. Of note, T11 compression fx of indeterminate age.  Clinical Impression  Pt is a anxious/tearful 74 year old female who was admitted for due to fall and suicidal attempt, currently under IVC at this time. Pt very nervous about therapy and reports she is "afraid to use the RW because it may make her fall." Pt performs bed mobility, transfers, and very limited ambulation with min assist. HR increased to 112bpm with minimal ambulation. Fear and incontinence prevented further ambulation at this time. Pt demonstrates deficits with strength/functional mobility. Reports having difficulty performing ADLs and is very fearful of showering. OT consult placed. Would benefit from skilled PT to address above deficits and promote optimal return to PLOF. Currently recommend [facility] psych placement with additional HHPT when stable.    Follow Up Recommendations [facility] psych with HHPT following)    Equipment Recommendations  Rolling walker with 5" wheels    Recommendations for Other Services       Precautions / Restrictions Precautions Precautions: Fall Restrictions Weight Bearing Restrictions: No      Mobility  Bed Mobility Overal bed mobility: Needs Assistance Bed Mobility: Supine to Sit     Supine to sit: Min assist     General bed mobility comments: needs assist for initiation of movement. ONce seated, able to sit with upright posture.  Transfers Overall transfer level: Needs assistance Equipment used: None Transfers: Sit to/from Stand Sit to Stand: Min  assist         General transfer comment: 1st attempt with no AD, however is reaching for therapist hands. Able to demonstrate upright posture, however pt very fearful and nervous she will fall despite being at bedside. 2nd attempt with RW. Still anxious  Ambulation/Gait Ambulation/Gait assistance: Min assist Gait Distance (Feet): 2 Feet Assistive device: Rolling walker (2 wheeled) Gait Pattern/deviations: Step-to pattern     General Gait Details: able to take 2 small steps at bedside. LImited due to incontient episode on floor. Very fearful and anxious about line/leads attachement. Further ambulation deferred  Stairs            Wheelchair Mobility    Modified Rankin (Stroke Patients Only)       Balance Overall balance assessment: Needs assistance;History of Falls Sitting-balance support: Feet unsupported;Bilateral upper extremity supported Sitting balance-Leahy Scale: Fair     Standing balance support: Bilateral upper extremity supported Standing balance-Leahy Scale: Fair                               Pertinent Vitals/Pain Pain Assessment: Faces Faces Pain Scale: Hurts little more Pain Location: low back with movement Pain Descriptors / Indicators: Discomfort Pain Intervention(s): Limited activity within patient's tolerance;Repositioned    Home Living Family/patient expects to be discharged to:: Private residence Living Arrangements: Spouse/significant other Available Help at Discharge: Family;Available 24 hours/day Type of Home: House Home Access: Stairs to enter Entrance Stairs-Rails: Can reach both(pt reports she doesn't trust the railings) Entrance Stairs-Number of Steps: 2 Home Layout: One level Home Equipment: None      Prior Function Level of Independence: Needs assistance  Gait / Transfers Assistance Needed: she reports she reaches out for furniture and has had multiple falls. She states she doesn't go outside her house unless  absolutely necessary. She spends her days sitting in her chair reading mystery novels.  ADL's / Homemaking Assistance Needed: she is scared of showering and reports incontience.        Hand Dominance        Extremity/Trunk Assessment   Upper Extremity Assessment Upper Extremity Assessment: Generalized weakness(B UE grossly 3+/5)    Lower Extremity Assessment Lower Extremity Assessment: Generalized weakness(B LE grossly 3+/5)       Communication   Communication: No difficulties  Cognition Arousal/Alertness: Awake/alert Behavior During Therapy: Anxious Overall Cognitive Status: Within Functional Limits for tasks assessed                                 General Comments: tearful and emotional      General Comments      Exercises Other Exercises Other Exercises: supine ther-ex performed on B LE including AP, SLRs, hip abd/add, SAQ, and B UE shoulder flexion. ALl ther-ex performed x 10 reps with supervision Other Exercises: pt with incontinent episode in bed/floor, RN notified.   Assessment/Plan    PT Assessment Patient needs continued PT services  PT Problem List Decreased strength;Decreased activity tolerance;Decreased balance;Decreased mobility;Decreased knowledge of use of DME       PT Treatment Interventions Gait training;Therapeutic exercise;Balance training;DME instruction    PT Goals (Current goals can be found in the Care Plan section)  Acute Rehab PT Goals Patient Stated Goal: to take better care of herself PT Goal Formulation: With patient Time For Goal Achievement: 05/16/19 Potential to Achieve Goals: Fair    Frequency Min 2X/week   Barriers to discharge        Co-evaluation               AM-PAC PT "6 Clicks" Mobility  Outcome Measure Help needed turning from your back to your side while in a flat bed without using bedrails?: None Help needed moving from lying on your back to sitting on the side of a flat bed without using  bedrails?: None Help needed moving to and from a bed to a chair (including a wheelchair)?: A Lot Help needed standing up from a chair using your arms (e.g., wheelchair or bedside chair)?: A Lot Help needed to walk in hospital room?: A Lot Help needed climbing 3-5 steps with a railing? : A Lot 6 Click Score: 16    End of Session   Activity Tolerance: Treatment limited secondary to medical complications (Comment) Patient left: in bed Nurse Communication: Mobility status PT Visit Diagnosis: Unsteadiness on feet (R26.81);Muscle weakness (generalized) (M62.81);Repeated falls (R29.6);Difficulty in walking, not elsewhere classified (R26.2)    Time: 1036-1100 PT Time Calculation (min) (ACUTE ONLY): 24 min   Charges:   PT Evaluation $PT Eval Moderate Complexity: 1 Mod PT Treatments $Therapeutic Exercise: 8-22 mins        Elizabeth Palau, PT, DPT [phone]   Elnoria Livingston 05/02/2019, 2:42 PM

## 2019-05-02 NOTE — ED Notes (Signed)
Attempted to call TTS back to discuss fracture that they say Sharon Hogan is questioning but phone was "unreachable".  From what I can see on the chart patient's XR shows T11 compression fracture of an undeterminate age.

## 2019-05-02 NOTE — Care Management (Addendum)
RN CM: Patient being followed and placed inpatient psych by TTS. No TOC needs at this time. Will complete consult.

## 2019-05-02 NOTE — BH Assessment (Signed)
Late Entry- Per the request of [facility] [phone]), writer sent them; IVC, COVID, Chest, Thoracic Spine, Lumber Spine X-ray's and EKG.  [place] also requested a Ortho Consult about whether or not the patient will need ongoing care from the fall (bone fracture) she had prior to coming to the ER. Writer spoke with ER MD (Dr. Erma Heritage) about it. He will put a note for the request. Per the request of [facility] [phone]), writer sent them; IVC, COVID, Chest, Thoracic Spine, Lumber Spine X-ray's and EKG.  [place] also requested a Ortho Consult about whether or not the patient will need ongoing care from the fall (bone fracture) she had prior to coming to the ER. Writer spoke with ER MD (Dr. Erma Heritage) about it. He will put a note for the request.

## 2019-05-02 NOTE — ED Notes (Signed)
IVC pending placement 

## 2019-05-02 NOTE — BH Assessment (Addendum)
Patient currently under review by [facility], admission staff Amy requesting information from patient's nurse regarding a fracture, Nurse can call back to number [phone] Amy

## 2019-05-02 NOTE — ED Notes (Signed)
Pt noted to have soiled sheets. New sheets, brief and purewick placed on pt. Pt repositioned in bed. Pt refusing to eat dinner, pt states "It makes me too nervous to eat".

## 2019-05-02 NOTE — ED Notes (Signed)
Pt given lunch tray.

## 2019-05-02 NOTE — ED Notes (Signed)
Pt given meal tray and water. Pt ate 100% of meal tray.

## 2019-05-02 NOTE — ED Notes (Signed)
Pt's husband called for update, stated he has several doses of Vraylar at the house that he can bring if necessary. Informed husband Fleet of plan of care as well and that he can ED anytime for update.

## 2019-05-02 NOTE — ED Notes (Signed)
Patient refused to get a bed bath., notified nurse Pattricia Boss

## 2019-05-02 NOTE — ED Provider Notes (Signed)
EKG: Interpreted by me, sinus tachycardia with rate of 101 bpm, low voltage, normal axis, normal QT   Emily Filbert, MD 05/02/19 1426

## 2019-05-02 NOTE — ED Provider Notes (Signed)
Asked by TTS that Sharon Hogan is requesting consult for questionable T11 mild SEP fx. Pt has no significant pain here, no LE weakness/numbness or signs of cord compression. There is minimal if any height loss. Treatment will be symptomatic with tylenol, NSAIDs PRN. No indication for emergent surgical referral or evaluation.   Shaune Pollack, MD 05/02/19 Ebony Cargo

## 2019-05-03 DIAGNOSIS — F319 Bipolar disorder, unspecified: Secondary | ICD-10-CM | POA: Diagnosis not present

## 2019-05-03 MED ORDER — ONDANSETRON HCL 4 MG/2ML IJ SOLN
4.0000 mg | Freq: Once | INTRAMUSCULAR | Status: AC
  Administered 2019-05-03: 4 mg via INTRAVENOUS
  Filled 2019-05-03: qty 2

## 2019-05-03 NOTE — ED Notes (Signed)
OT at bedside. 

## 2019-05-03 NOTE — BH Assessment (Signed)
Referral information for Psychiatric Hospitalization faxed to;    [facility] [phone]- [phone]), Denied   [facility] [phone]) [phone] No beds available, currently on waitlist   [facility] ([phone]), No current beds available    [facility] [phone]), Re-faxed manually   [facility] [phone] or [phone]) Currently under review by Margreta Journey [phone] or [phone]), Currently under review by Amy

## 2019-05-03 NOTE — ED Provider Notes (Signed)
-----------------------------------------   5:59 AM on 05/03/2019 -----------------------------------------   Blood pressure 128/69, pulse 85, temperature 97.6 F (36.4 C), temperature source Oral, resp. rate 19, height 5\' 4"  (1.626 m), weight 68 kg, SpO2 94 %.  The patient had no acute events since last update.  Calm and cooperative at this time.  Disposition is pending per Psychiatry/Behavioral Medicine team recommendations.     , Don Perking, MD 05/03/19 [phone]

## 2019-05-03 NOTE — ED Notes (Signed)
No meal tray sent for this pt. Pt has diet order in. This RN called dietary to request a tray be sent to ED.

## 2019-05-03 NOTE — ED Notes (Signed)
Before being transported pt stating she does not want her soiled clothes and wants this RN to throw them in the trash.

## 2019-05-03 NOTE — ED Notes (Signed)
Pt husband updated on transfer.

## 2019-05-03 NOTE — BH Assessment (Signed)
Patient is currently under review with [facility] with admissions staff [facility], staff will contact TTS back with an update.

## 2019-05-03 NOTE — ED Notes (Signed)
This RN spoke with OT. OT stating they were able to assist pt in performing bed bath to upper body and was able to get pt to edge of bed. Per OT pt did not want to have hair washed. OT suggested the wash cap but pt was hesitant. OT stating there were also able to work with pt on anxiety coping techniques.

## 2019-05-03 NOTE — ED Provider Notes (Signed)
-----------------------------------------   2:52 PM on 05/03/2019 -----------------------------------------  Patient has been accepted to [facility].  We will arrange transport as soon as this is available.   Minna Antis, MD 05/03/19 1452

## 2019-05-03 NOTE — ED Notes (Signed)
Pt able to eat breakfast biscuit and drink orange juice for this RN.

## 2019-05-03 NOTE — Evaluation (Signed)
Occupational Therapy Evaluation Patient Details Name: Sharon Hogan MRN: [id number] DOB: 05/03/1945 Today's Date: 05/03/2019    History of Present Illness Pt admitted for suicidal attempt by intentional drug overdose. Pt will fall and transported to hospital. HIstory includes depression, anxiety, agorophobia, and claustrophobia. Pt is generally dischevled and unkempt. Per notes, hasn't showered since last July and is unable to care for herself. Of note, T11 compression fx of indeterminate age.   Clinical Impression   Pt presents to OT anxious yet agreeable to today's evaluation.   She reports no longer showering or completing BADL at home secondary to anxiety and feeling weak.  She was briefly tearful during today's session, stating that she feels ashamed she can no longer take care of herself.  Pt reports increased tremors in BUE today, which impacted her ability to open wrapper of orange juice.  Pt able to otherwise self-feed independently.  Pt was agreeable to completing sponge bath sitting EOB.  OTR provided mod A to complete upper body bathing, as well as upper BLE.  OTR educated pt in grounding strategies for anxiety management, including deep breathing and 5-4-3-2-1 senses activity.  Pt demonstrated use of both strategies prior to sponge bath and reported slight improvement in her anxiety.  Pt declined any OOB activity today 2/2 feeling weak.  RN notified that pt was agreeable to seated sponge bath and is experiencing increased BUE tremor.  Recommend inpatient psychiatric care after d/c, with home health OT as appropriate once medically stable.    Follow Up Recommendations  Supervision/Assistance - 24 hour;Other (comment)(pt many benefit from inpatient psychiatric care with home health OT as appropriate once medically stable)    Equipment Recommendations  3 in 1 bedside commode    Recommendations for Other Services       Precautions / Restrictions Precautions Precautions:  Fall Restrictions Weight Bearing Restrictions: No      Mobility Bed Mobility Overal bed mobility: Needs Assistance Bed Mobility: Supine to Sit     Supine to sit: Min assist     General bed mobility comments: needs assist for initiation of movement. Once seated, able to sit with upright posture.  Transfers Overall transfer level: Needs assistance               General transfer comment: Pt declined OOB activity today    Balance Overall balance assessment: Needs assistance;History of Falls Sitting-balance support: Feet unsupported;Single extremity supported Sitting balance-Leahy Scale: Good                                     ADL either performed or assessed with clinical judgement   ADL                                         General ADL Comments: Pt reports not showering in months 2/2 fear of falling and feeling weak.  She reports incontinence and difficulty completing BADL 2/2 weakness.  OTR provided mod A to wash pt's upper body and upper BLE while seated EOB.  OTR provided intermittent min A for bed mobility.  Pt declined OOB activity today 2/2 anxiety and weakness.     Vision Baseline Vision/History: Wears glasses Wears Glasses: Reading only Patient Visual Report: No change from baseline Vision Assessment?: No apparent visual deficits     Perception  Praxis      Pertinent Vitals/Pain Pain Assessment: No/denies pain     Hand Dominance Right   Extremity/Trunk Assessment Upper Extremity Assessment Upper Extremity Assessment: Overall WFL for tasks assessed;Generalized weakness(pt reports general weakness in BUE, but presents with functional ROM and strength to wash the front of her body and assist with bed mobility)   Lower Extremity Assessment Lower Extremity Assessment: Defer to PT evaluation   Cervical / Trunk Assessment Cervical / Trunk Assessment: Normal   Communication Communication Communication: No  difficulties   Cognition Arousal/Alertness: Awake/alert Behavior During Therapy: Anxious Overall Cognitive Status: Within Functional Limits for tasks assessed                                 General Comments: Pt briefly tearful and emotional intermittently, stating she "feels ashamed"   General Comments  Pt has dry skin patches all over body and scalp.  Pt presenting with rash and redness on chest under breasts, with minimal bleeding when pt dried area with her towel.    Exercises Other Exercises Other Exercises: provided education surrounding self care, bathing, safety precautions, and grounding strategies for anxiety managment   Shoulder Instructions      Home Living Family/patient expects to be discharged to:: Private residence Living Arrangements: Spouse/significant other Available Help at Discharge: Family;Available 24 hours/day Type of Home: House Home Access: Stairs to enter [employer] of Steps: 2 Entrance Stairs-Rails: Can reach both Home Layout: One level     Bathroom Shower/Tub: Tub/shower unit;Curtain         Home Equipment: None          Prior Functioning/Environment Level of Independence: Needs assistance  Gait / Transfers Assistance Needed: she reports she reaches out for furniture and has had multiple falls. She states she doesn't go outside her house unless absolutely necessary. She spends her days sitting in her chair reading mystery novels. ADL's / Homemaking Assistance Needed: she is scared of showering, especially falling in the shower and reports incontinence.            OT Problem List: Decreased strength;Decreased activity tolerance;Impaired balance (sitting and/or standing);Decreased knowledge of use of DME or AE      OT Treatment/Interventions: Therapeutic activities;Therapeutic exercise;Visual/perceptual remediation/compensation;DME and/or AE instruction;Patient/family education;Balance training    OT  Goals(Current goals can be found in the care plan section) Acute Rehab OT Goals Patient Stated Goal: to take better care of herself OT Goal Formulation: With patient Time For Goal Achievement: 05/17/19 Potential to Achieve Goals: Fair ADL Goals Pt Will Perform Upper Body Bathing: sitting;with min assist;with adaptive equipment Pt Will Perform Lower Body Bathing: sit to/from stand;with min assist;with adaptive equipment Pt Will Transfer to Toilet: with supervision;ambulating;bedside commode(with LRAD) Additional ADL Goal #1: Pt will demonstrate use of 3 grounding strategies for anxiety management and improved participation in ADL  OT Frequency: Min 1X/week   Barriers to D/C:            Co-evaluation              AM-PAC OT "6 Clicks" Daily Activity     Outcome Measure Help from another person eating meals?: A Little Help from another person taking care of personal grooming?: A Little Help from another person toileting, which includes using toliet, bedpan, or urinal?: A Lot Help from another person bathing (including washing, rinsing, drying)?: A Lot Help from another person to put on and taking off  regular upper body clothing?: A Little Help from another person to put on and taking off regular lower body clothing?: A Lot 6 Click Score: 15   End of Session Nurse Communication: Other (comment)(notified RN about increased BUE tremors)  Activity Tolerance: Patient tolerated treatment well Patient left: in bed  OT Visit Diagnosis: Other abnormalities of gait and mobility (R26.89);Muscle weakness (generalized) (M62.81);History of falling (Z91.81)                Time: 4628-6381 OT Time Calculation (min): 47 min Charges:  OT General Charges $OT Visit: 1 Visit OT Evaluation $OT Eval Moderate Complexity: 1 Mod OT Treatments $Self Care/Home Management : 23-37 mins  Kathyrn Drown, OTR/L 05/03/19, 12:12 PM

## 2019-05-03 NOTE — ED Notes (Signed)
Pt c/o nausea/ MD made aware. No new orders at this time  

## 2019-05-03 NOTE — BH Assessment (Signed)
Writer received a return phone call from patient's husband IT trainer). He shared he already spoke with someone from [facility] about her going there. He already had the contact number for [facility] but writer provided him with the number to the nurse's station.

## 2019-05-03 NOTE — ED Notes (Signed)
Pt given morning medicine and breakfast tray placed at bedside.

## 2019-05-03 NOTE — ED Notes (Signed)
Report to include Situation, Background, Assessment, and Recommendations received from RN. Patient alert and oriented. Pt with notable large dry flakes on head, face and back. Pt declines this RN performing cleaning at this time. Patient denies SI, HI, AVH and pain. Patient made aware of Q15 minute rounds and Psychologist, counselling presence for their safety. Patient instructed to come to me with needs or concerns.

## 2019-05-03 NOTE — BH Assessment (Signed)
Patient has been accepted to [facility].  Patient assigned to room 152-1 Accepting physician is Dr. Loann Quill.  Call report to [phone].  Representative was [employer].   ER Staff is aware of it:  Nitchia, ER Secretary Tresa Endo, Patient's Nurse      Writer called and left a HIPPA Compliant message with husband [employer] [phone]), requesting a return phone call.  Address: [facility] [address] [place], [place] [address]  Patient will need to arrive before 11pm. If she can't come before then, she will need to arrive

## 2019-05-04 MED ORDER — MAGNESIUM HYDROXIDE 400 MG/5ML PO SUSP
15.00 | ORAL | Status: DC
Start: ? — End: 2019-05-04

## 2019-05-04 MED ORDER — CARIPRAZINE HCL 3 MG PO CAPS
3.00 | ORAL_CAPSULE | ORAL | Status: DC
Start: 2019-05-04 — End: 2019-05-04

## 2019-05-04 MED ORDER — ACETAMINOPHEN 325 MG PO TABS
650.00 | ORAL_TABLET | ORAL | Status: DC
Start: ? — End: 2019-05-04

## 2019-05-04 MED ORDER — GENERIC EXTERNAL MEDICATION
Status: DC
Start: ? — End: 2019-05-04

## 2019-05-04 MED ORDER — ONDANSETRON 4 MG PO TBDP
4.00 | ORAL_TABLET | ORAL | Status: DC
Start: ? — End: 2019-05-04

## 2019-05-04 MED ORDER — MICONAZOLE NITRATE 2 % EX POWD
CUTANEOUS | Status: DC
Start: 2019-05-10 — End: 2019-05-04

## 2019-05-04 MED ORDER — ALUM & MAG HYDROXIDE-SIMETH 200-200-20 MG/5ML PO SUSP
30.00 | ORAL | Status: DC
Start: ? — End: 2019-05-04

## 2019-05-04 MED ORDER — CEFDINIR 300 MG PO CAPS
300.00 | ORAL_CAPSULE | ORAL | Status: DC
Start: 2019-05-04 — End: 2019-05-04

## 2019-05-04 MED ORDER — PANTOPRAZOLE SODIUM 40 MG PO TBEC
40.00 | DELAYED_RELEASE_TABLET | ORAL | Status: DC
Start: 2019-05-10 — End: 2019-05-04

## 2019-05-04 MED ORDER — LAMOTRIGINE 100 MG PO TABS
200.00 | ORAL_TABLET | ORAL | Status: DC
Start: 2019-05-04 — End: 2019-05-04

## 2019-05-08 MED ORDER — LURASIDONE HCL 40 MG PO TABS
40.00 | ORAL_TABLET | ORAL | Status: DC
Start: 2019-05-08 — End: 2019-05-08

## 2019-05-08 MED ORDER — CARBAMIDE PEROXIDE 6.5 % OT SOLN
5.00 | OTIC | Status: DC
Start: 2019-05-08 — End: 2019-05-08

## 2019-05-08 MED ORDER — MELATONIN 3 MG PO TABS
3.00 | ORAL_TABLET | ORAL | Status: DC
Start: 2019-05-10 — End: 2019-05-08

## 2019-05-08 MED ORDER — SELENIUM SULFIDE 1 % EX LOTN
TOPICAL_LOTION | CUTANEOUS | Status: DC
Start: ? — End: 2019-05-08

## 2019-05-08 MED ORDER — CLONIDINE HCL 0.1 MG PO TABS
0.10 | ORAL_TABLET | ORAL | Status: DC
Start: ? — End: 2019-05-08

## 2019-05-08 MED ORDER — GENERIC EXTERNAL MEDICATION
Status: DC
Start: ? — End: 2019-05-08

## 2019-05-08 MED ORDER — CEFDINIR 300 MG PO CAPS
300.00 | ORAL_CAPSULE | ORAL | Status: DC
Start: 2019-05-10 — End: 2019-05-08

## 2019-05-08 MED ORDER — HYDROXYZINE PAMOATE 50 MG PO CAPS
50.00 | ORAL_CAPSULE | ORAL | Status: DC
Start: ? — End: 2019-05-08

## 2019-05-08 MED ORDER — SERTRALINE HCL 25 MG PO TABS
25.00 | ORAL_TABLET | ORAL | Status: DC
Start: 2019-05-11 — End: 2019-05-08

## 2019-05-08 NOTE — Progress Notes (Signed)
 [facility]  Physical Therapy Certification  Patient Details  Name: Sharon Hogan MRN: [id number] Date of Birth: 11-Jan-1946 Medical Diagnosis: Active Problems:   * No active hospital problems. *  Visit Diagnosis: PT Visit Diagnosis: Unsteadiness on feet (R26.81), Muscle weakness (generalized) (M62.81), Repeated falls (R29.6), Difficulty in walking, not elsewhere classified (R26.2) PT Problem: Decreased strength, Decreased activity tolerance, Decreased balance, Decreased mobility, Decreased knowledge of use of DME  Goals: Pt will Ambulate: 50 feet, with supervision, with rolling walker(in order to improve functional independence) Pt/caregiver will Perform Home Exercise Program: For increased strengthening, With Supervision, verbal cues required/provided(10-15 reps on B LE to improve strength) Additional Goal #1: Pt will be able to perform bed mobility/transfers with mod I and safe technique in order to improve functional independence  Duration: Services will be provided through the following date: 05/16/19  Frequency: Min 2X/week  Amount: one treatment session per day unless otherwise indicated.    Certification Start Date: 05/02/2019 Certification End Date: 05/16/19   PT Treatments/Interventions: Gait training, Therapeutic exercise, Balance training, DME instruction  Jeannette Maddy H. Manson Passey, PT, DPT, NCS 05/08/19, 10:22 AM [phone]

## 2019-05-10 MED ORDER — GENERIC EXTERNAL MEDICATION
Status: DC
Start: ? — End: 2019-05-10

## 2019-05-10 MED ORDER — GENERIC EXTERNAL MEDICATION
60.00 | Status: DC
Start: 2019-05-10 — End: 2019-05-10

## 2021-07-23 ENCOUNTER — Other Ambulatory Visit: Payer: Self-pay | Admitting: Internal Medicine

## 2021-07-23 DIAGNOSIS — Z1231 Encounter for screening mammogram for malignant neoplasm of breast: Secondary | ICD-10-CM

## 2021-08-11 LAB — COLOGUARD: COLOGUARD: NEGATIVE

## 2021-08-21 ENCOUNTER — Ambulatory Visit
Admission: RE | Admit: 2021-08-21 | Discharge: 2021-08-21 | Disposition: A | Discharge status: Home / Self Care | Payer: Medicare Other | Source: Ambulatory Visit | Attending: Internal Medicine | Admitting: Internal Medicine

## 2021-08-21 DIAGNOSIS — Z1231 Encounter for screening mammogram for malignant neoplasm of breast: Secondary | ICD-10-CM | POA: Insufficient documentation

## 2022-09-16 ENCOUNTER — Other Ambulatory Visit: Payer: Self-pay | Admitting: Internal Medicine

## 2022-09-16 DIAGNOSIS — Z1231 Encounter for screening mammogram for malignant neoplasm of breast: Secondary | ICD-10-CM

## 2022-10-01 ENCOUNTER — Encounter: Payer: Self-pay | Admitting: Radiology

## 2022-10-01 ENCOUNTER — Ambulatory Visit: Admission: RE | Admit: 2022-10-01 | Payer: Medicare Other | Source: Ambulatory Visit

## 2022-10-01 DIAGNOSIS — Z1231 Encounter for screening mammogram for malignant neoplasm of breast: Secondary | ICD-10-CM | POA: Diagnosis present

## 2022-10-26 IMAGING — MG MM DIGITAL SCREENING BILAT W/ TOMO AND CAD
8 series · 8 of 24 positions shown · non-contrast
Comparison: Previous exam(s).

CLINICAL DATA: Screening.

EXAM:
DIGITAL SCREENING BILATERAL MAMMOGRAM WITH TOMOSYNTHESIS AND CAD
TECHNIQUE: Bilateral screening digital craniocaudal and mediolateral oblique
mammograms were obtained. Bilateral screening digital breast
tomosynthesis was performed. The images were evaluated with
computer-aided detection.

[L CC synth-2D]
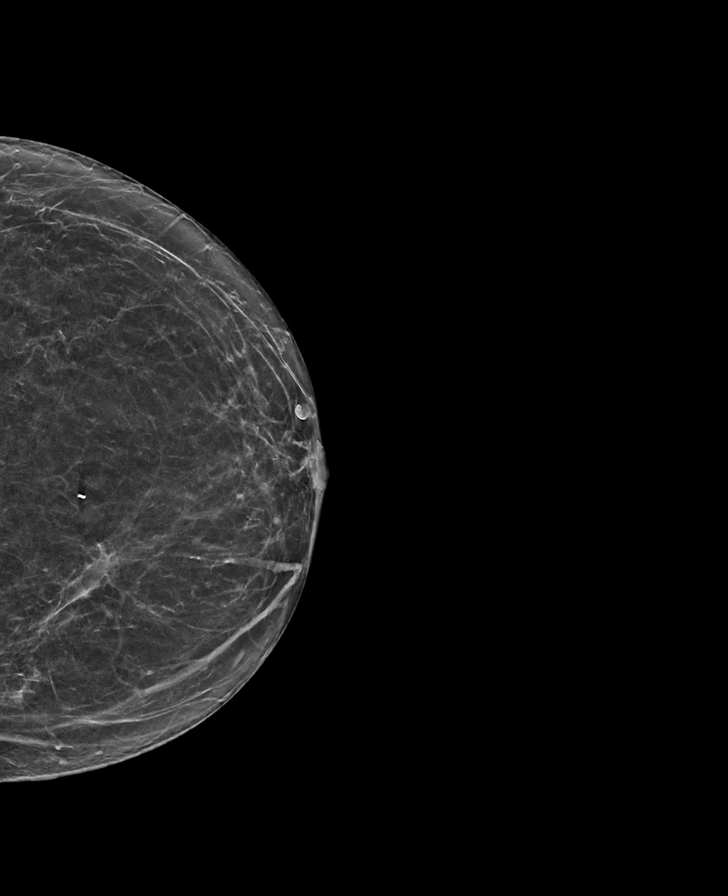

[R CC synth-2D]
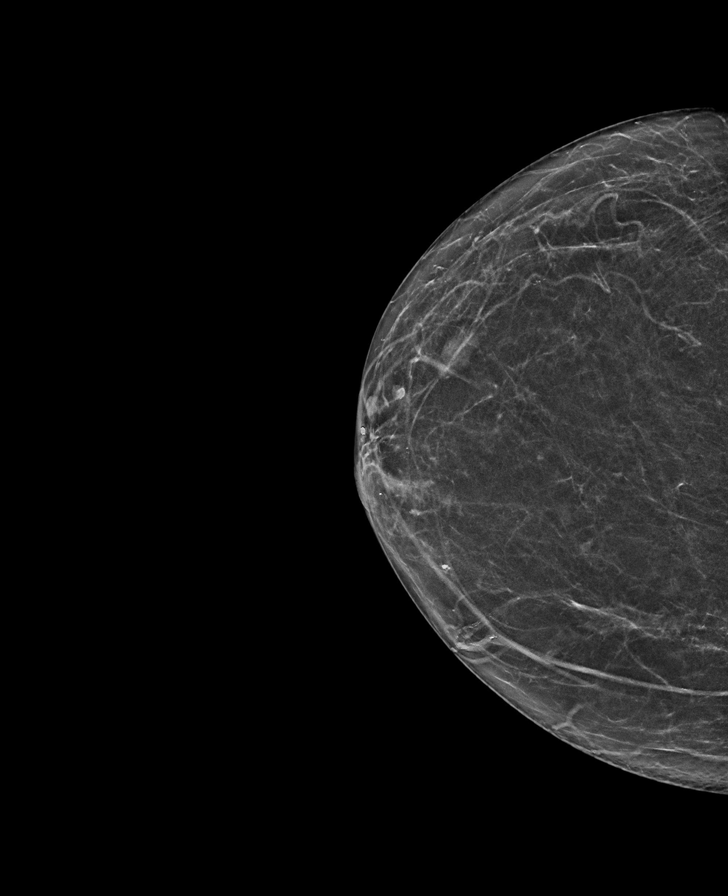

[R MLO synth-2D]
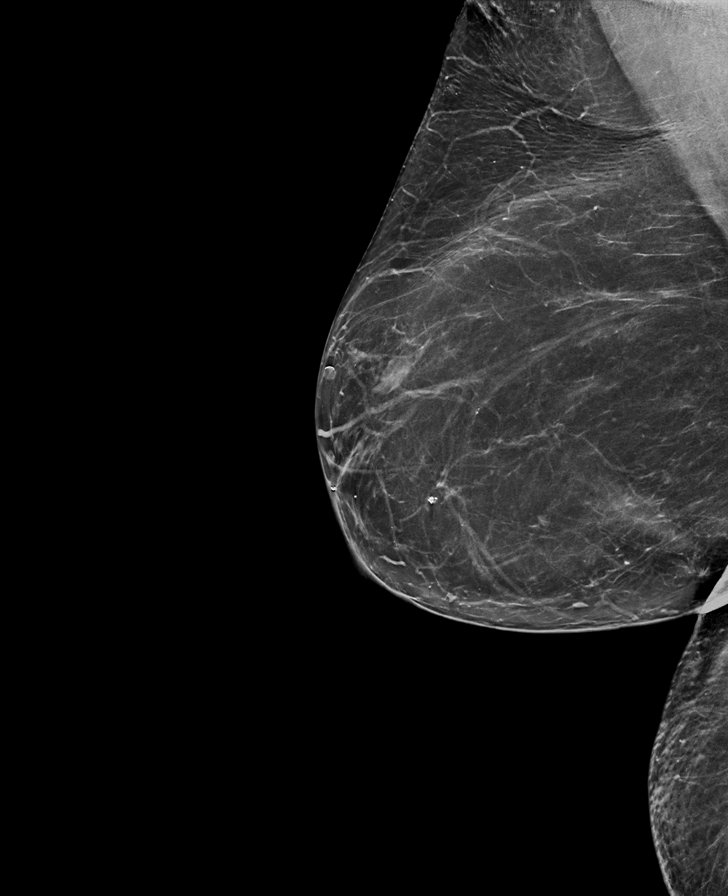

[L MLO synth-2D]
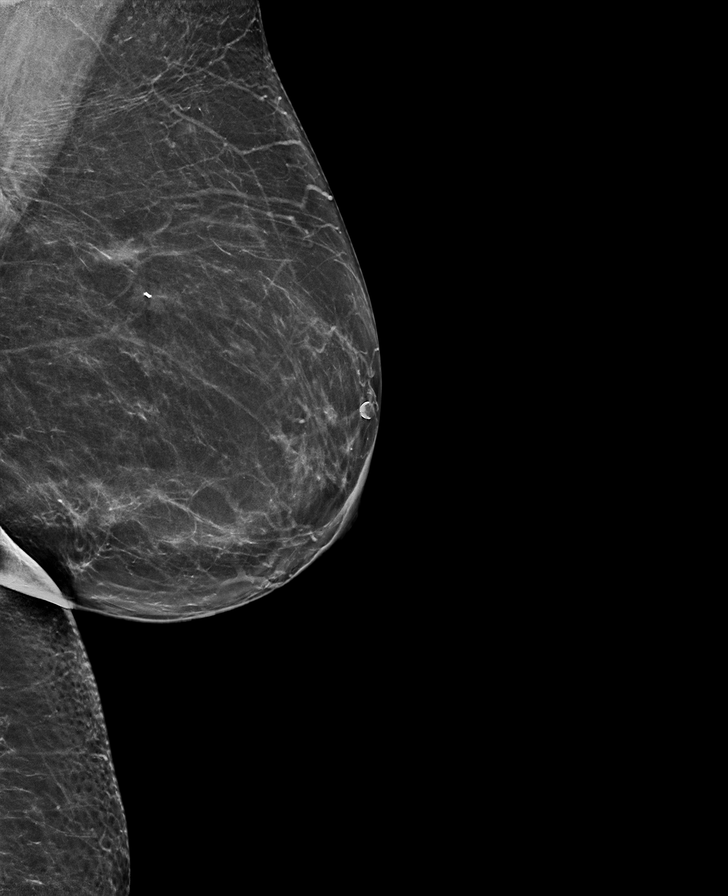

[L MLO tomo · tomo slice 39/77.0]
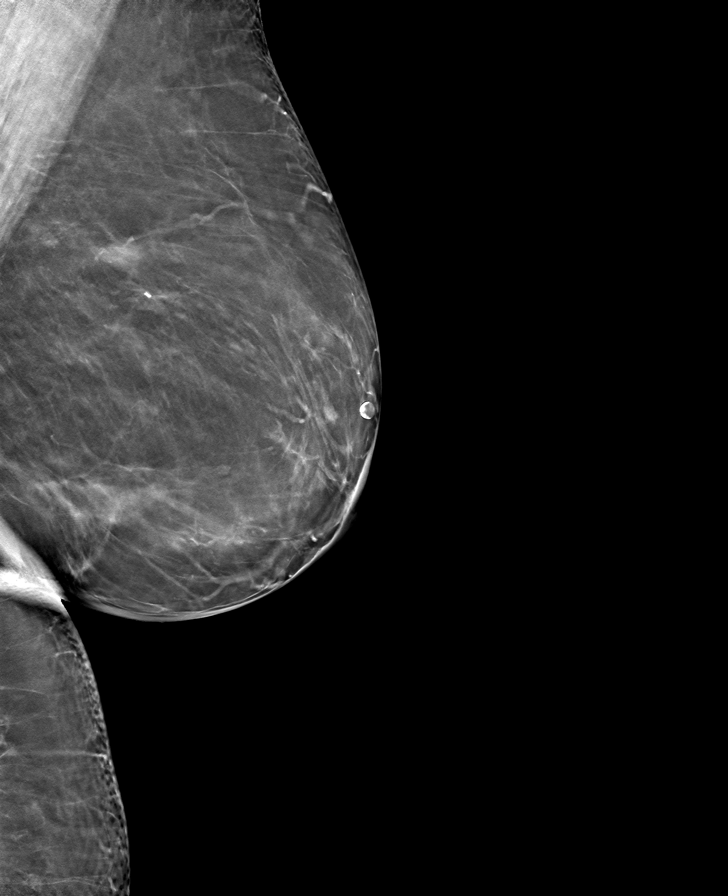

[R MLO tomo · tomo slice 39/78.0]
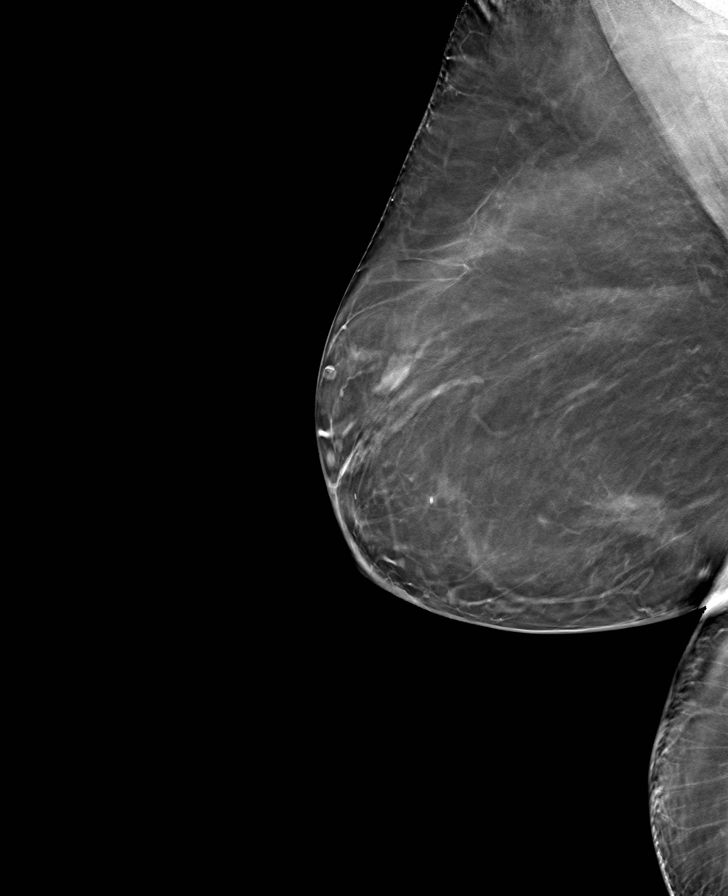

[R CC tomo · tomo slice 33/64.0]
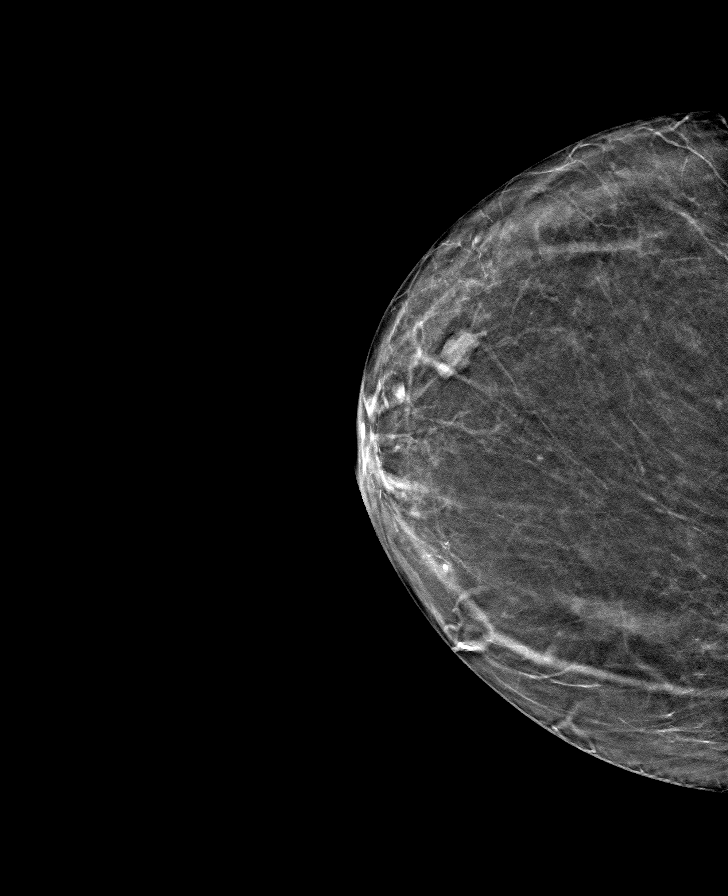

[L CC tomo · tomo slice 33/65.0]
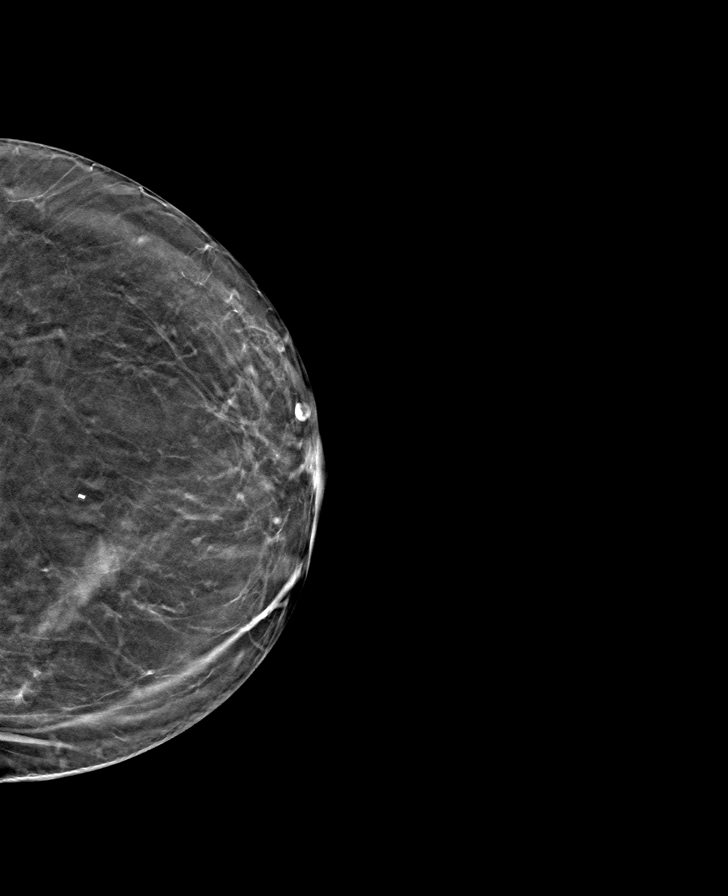

[8 of 24 positions shown; findings below may reference images not displayed]

ACR Breast Density Category b: There are scattered areas of
fibroglandular density.
FINDINGS: There are no findings suspicious for malignancy.
IMPRESSION: No mammographic evidence of malignancy. A result letter of this
screening mammogram will be mailed directly to the patient.

RECOMMENDATION:
Screening mammogram in one year. (Code:51-O-LD2)

BI-RADS CATEGORY  1: Negative.

## 2023-11-02 ENCOUNTER — Other Ambulatory Visit: Payer: Self-pay | Admitting: Internal Medicine

## 2023-11-02 DIAGNOSIS — Z1231 Encounter for screening mammogram for malignant neoplasm of breast: Secondary | ICD-10-CM

## 2023-12-28 ENCOUNTER — Ambulatory Visit
Admission: RE | Admit: 2023-12-28 | Discharge: 2023-12-28 | Disposition: A | Discharge status: Home / Self Care | Source: Ambulatory Visit | Attending: Internal Medicine | Admitting: Internal Medicine

## 2023-12-28 DIAGNOSIS — Z1231 Encounter for screening mammogram for malignant neoplasm of breast: Secondary | ICD-10-CM | POA: Diagnosis present
